# Patient Record
Sex: Male | Born: 1950 | Race: Asian | Hispanic: No | Marital: Married | Smoking: Heavy tobacco smoker
Health system: Southern US, Community
[De-identification: ages and names within clinical notes are randomized; demographics above are authoritative.]

## PROBLEM LIST (undated history)

## (undated) DIAGNOSIS — I10 Essential (primary) hypertension: Secondary | ICD-10-CM

## (undated) HISTORY — DX: Essential (primary) hypertension: I10

---

## 1997-07-31 ENCOUNTER — Emergency Department (HOSPITAL_COMMUNITY): Admission: EM | Admit: 1997-07-31 | Discharge: 1997-07-31 | Payer: Self-pay | Admitting: Emergency Medicine

## 1999-11-30 ENCOUNTER — Encounter: Admission: RE | Admit: 1999-11-30 | Discharge: 1999-11-30 | Payer: Self-pay | Admitting: Gastroenterology

## 1999-11-30 ENCOUNTER — Encounter: Payer: Self-pay | Admitting: Gastroenterology

## 2002-11-05 ENCOUNTER — Encounter: Payer: Self-pay | Admitting: Emergency Medicine

## 2002-11-05 ENCOUNTER — Emergency Department (HOSPITAL_COMMUNITY): Admission: EM | Admit: 2002-11-05 | Discharge: 2002-11-05 | Payer: Self-pay | Admitting: *Deleted

## 2002-11-21 ENCOUNTER — Encounter: Admission: RE | Admit: 2002-11-21 | Discharge: 2002-11-21 | Payer: Self-pay | Admitting: Gastroenterology

## 2002-12-15 ENCOUNTER — Encounter: Admission: RE | Admit: 2002-12-15 | Discharge: 2002-12-15 | Payer: Self-pay | Admitting: Internal Medicine

## 2003-02-19 ENCOUNTER — Ambulatory Visit (HOSPITAL_COMMUNITY): Admission: RE | Admit: 2003-02-19 | Discharge: 2003-02-19 | Payer: Self-pay | Admitting: Gastroenterology

## 2003-05-22 ENCOUNTER — Encounter (INDEPENDENT_AMBULATORY_CARE_PROVIDER_SITE_OTHER): Payer: Self-pay | Admitting: Specialist

## 2003-05-22 ENCOUNTER — Ambulatory Visit (HOSPITAL_COMMUNITY): Admission: RE | Admit: 2003-05-22 | Discharge: 2003-05-22 | Payer: Self-pay | Admitting: Gastroenterology

## 2004-01-14 ENCOUNTER — Emergency Department (HOSPITAL_COMMUNITY): Admission: EM | Admit: 2004-01-14 | Discharge: 2004-01-14 | Payer: Self-pay | Admitting: Emergency Medicine

## 2006-01-31 ENCOUNTER — Encounter: Admission: RE | Admit: 2006-01-31 | Discharge: 2006-01-31 | Payer: Self-pay | Admitting: Internal Medicine

## 2006-09-12 ENCOUNTER — Emergency Department (HOSPITAL_COMMUNITY): Admission: EM | Admit: 2006-09-12 | Discharge: 2006-09-12 | Payer: Self-pay | Admitting: Podiatry

## 2008-09-21 ENCOUNTER — Emergency Department (HOSPITAL_COMMUNITY): Admission: EM | Admit: 2008-09-21 | Discharge: 2008-09-21 | Payer: Self-pay | Admitting: Emergency Medicine

## 2008-11-13 ENCOUNTER — Encounter: Admission: RE | Admit: 2008-11-13 | Discharge: 2008-11-13 | Payer: Self-pay | Admitting: Otolaryngology

## 2009-03-01 ENCOUNTER — Encounter: Admission: RE | Admit: 2009-03-01 | Discharge: 2009-03-01 | Payer: Self-pay | Admitting: Otolaryngology

## 2010-02-11 ENCOUNTER — Encounter (INDEPENDENT_AMBULATORY_CARE_PROVIDER_SITE_OTHER): Payer: Self-pay | Admitting: *Deleted

## 2010-02-11 LAB — CONVERTED CEMR LAB: PSA: 6.99 ng/mL — ABNORMAL HIGH (ref <=4.00)

## 2010-04-22 LAB — CBC
HCT: 44 % (ref 39.0–52.0)
Hemoglobin: 15 g/dL (ref 13.0–17.0)
MCV: 96.3 fL (ref 78.0–100.0)
RBC: 4.57 MIL/uL (ref 4.22–5.81)

## 2010-04-22 LAB — COMPREHENSIVE METABOLIC PANEL
Albumin: 3.8 g/dL (ref 3.5–5.2)
BUN: 11 mg/dL (ref 6–23)
CO2: 29 mEq/L (ref 19–32)
Chloride: 103 mEq/L (ref 96–112)
Creatinine, Ser: 0.8 mg/dL (ref 0.4–1.5)
GFR calc Af Amer: >60 mL/min (ref >60)
Glucose, Bld: 94 mg/dL (ref 70–99)
Total Protein: 7.3 g/dL (ref 6.0–8.3)

## 2010-04-22 LAB — URINALYSIS, ROUTINE W REFLEX MICROSCOPIC
Ketones, ur: NEGATIVE mg/dL
Protein, ur: NEGATIVE mg/dL
Specific Gravity, Urine: 1.007 (ref 1.005–1.030)
Urobilinogen, UA: 0.2 mg/dL (ref 0.0–1.0)
pH: 7 (ref 5.0–8.0)

## 2010-04-22 LAB — DIFFERENTIAL
Eosinophils Relative: 1 % (ref 0–5)
Monocytes Absolute: 0.6 10*3/uL (ref 0.1–1.0)
Monocytes Relative: 8 % (ref 3–12)
Neutro Abs: 4.3 10*3/uL (ref 1.7–7.7)

## 2010-04-22 LAB — URINE MICROSCOPIC-ADD ON

## 2010-06-03 NOTE — Op Note (Signed)
NAME:  Jay Bell, Jay Bell                                ACCOUNT NO.:  [id number]   MEDICAL RECORD NO.:  [id number]                   PATIENT TYPE:  AMB   LOCATION:  ENDO                                 FACILITY:  [facility]   PHYSICIAN:  Bernette Redbird, M.D.                DATE OF BIRTH:  1950/02/04   DATE OF PROCEDURE:  05/22/2003  DATE OF DISCHARGE:                                 OPERATIVE REPORT   PROCEDURE:  Upper endoscopy with biopsies.   INDICATIONS FOR PROCEDURE:  A 60 year old Asian immigrant with nonspecific  dyspeptic symptoms, finally responding to a combination of Zelnorm and  Prilosec. He has previously been treated for H. pylori infection.   FINDINGS:  Normal exam.   DESCRIPTION OF PROCEDURE:  The nature, purpose and risk of the procedure had  been discussed with the patient who provided written consent.  Sedation was  fentanyl 50 mcg and Versed 5 mg without arrhythmias or desaturations.  The  Olympus video endoscope was passed under direct vision. The vocal cords were  unremarkable in appearance. The esophagus was readily entered and was  normal, without evidence of reflux esophagitis, Barrett's esophagus,  varices, infection, neoplasia or any ring, stricture or hiatal hernia. The  stomach contained no significant residual and had normal mucosa without  evidence of gastritis, erosions, ulcers, polyps or masses including  retroflexion of the proximal stomach. The pylorus, duodenal bulb and second  duodenum looked normal. Prior to removal of the scope, duodenal and antral  biopsies were obtained.  The patient tolerated the procedure well and there  were no apparent complications.   IMPRESSION:  Normal endoscopy.  No source of previous dyspeptic symptoms  endoscopically evident.   PLAN:  Await pathology results.  Continue current medical therapy.                                               Bernette Redbird, M.D.    RB/MEDQ  D:  05/22/2003  T:  05/22/2003  Job:  [id number]   cc:   Gabriel Earing, M.D.  [address]  [place]  [place] [address]  Fax: [phone]

## 2012-02-16 ENCOUNTER — Encounter (HOSPITAL_COMMUNITY): Payer: Self-pay

## 2012-02-16 ENCOUNTER — Emergency Department (INDEPENDENT_AMBULATORY_CARE_PROVIDER_SITE_OTHER): Payer: No Typology Code available for payment source

## 2012-02-16 ENCOUNTER — Emergency Department (INDEPENDENT_AMBULATORY_CARE_PROVIDER_SITE_OTHER)
Admission: EM | Admit: 2012-02-16 | Discharge: 2012-02-16 | Disposition: A | Discharge status: Home / Self Care | Payer: No Typology Code available for payment source | Source: Home / Self Care | Attending: Family Medicine | Admitting: Family Medicine

## 2012-02-16 DIAGNOSIS — I10 Essential (primary) hypertension: Secondary | ICD-10-CM

## 2012-02-16 DIAGNOSIS — Z23 Encounter for immunization: Secondary | ICD-10-CM

## 2012-02-16 DIAGNOSIS — F17200 Nicotine dependence, unspecified, uncomplicated: Secondary | ICD-10-CM

## 2012-02-16 DIAGNOSIS — R1013 Epigastric pain: Secondary | ICD-10-CM

## 2012-02-16 DIAGNOSIS — K219 Gastro-esophageal reflux disease without esophagitis: Secondary | ICD-10-CM

## 2012-02-16 LAB — LIPID PANEL
Cholesterol: 226 mg/dL — ABNORMAL HIGH (ref 0–200)
Total CHOL/HDL Ratio: 5.8 RATIO
VLDL: 55 mg/dL — ABNORMAL HIGH (ref 0–40)

## 2012-02-16 LAB — COMPREHENSIVE METABOLIC PANEL
ALT: 19 U/L (ref 0–53)
Albumin: 3.6 g/dL (ref 3.5–5.2)
BUN: 14 mg/dL (ref 6–23)
Calcium: 9.2 mg/dL (ref 8.4–10.5)
Chloride: 104 mEq/L (ref 96–112)
Creatinine, Ser: 0.69 mg/dL (ref 0.50–1.35)
GFR calc Af Amer: >90 mL/min (ref >90)
Glucose, Bld: 100 mg/dL — ABNORMAL HIGH (ref 70–99)
Sodium: 140 mEq/L (ref 135–145)
Total Protein: 7.5 g/dL (ref 6.0–8.3)

## 2012-02-16 LAB — CBC
HCT: 45.3 % (ref 39.0–52.0)
MCH: 33.3 pg (ref 26.0–34.0)
MCV: 94.8 fL (ref 78.0–100.0)
Platelets: 252 10*3/uL (ref 150–400)

## 2012-02-16 LAB — TSH: TSH: 0.853 u[IU]/mL (ref 0.350–4.500)

## 2012-02-16 MED ORDER — HYDROCHLOROTHIAZIDE 12.5 MG PO TABS: 12.5000 mg | ORAL_TABLET | Freq: Every day | ORAL | Status: DC

## 2012-02-16 MED ORDER — OMEPRAZOLE 20 MG PO CPDR: 60.0000 mg | DELAYED_RELEASE_CAPSULE | Freq: Every day | ORAL | Status: DC

## 2012-02-16 MED ORDER — INFLUENZA VIRUS VACC SPLIT PF IM SUSP
0.5000 mL | Freq: Once | INTRAMUSCULAR | Status: AC
  Administered 2012-02-16: 0.5 mL via INTRAMUSCULAR

## 2012-02-16 MED ORDER — SERTRALINE HCL 25 MG PO TABS: 25.0000 mg | ORAL_TABLET | Freq: Every day | ORAL | Status: DC

## 2012-02-16 NOTE — ED Provider Notes (Signed)
History   CSN: [id number]  Arrival date & time 02/16/12  1013   First MD Initiated Contact with Patient 02/16/12 1028     Chief Complaint  Patient presents with  . Abdominal Pain   HPI Pt reports that he has had 4 months of symptoms of pain in right back of rib cage.   Not much pain with taking a deep breath.   Pt says that he has not been coughing or having cold symptoms or sneezing.  The patient also has a history of severe acid reflux disease.  He has been evaluated by gastroenterology several years ago but because he lost his medical insurance he has lost his access to GI care.  The patient reports that he takes omeprazole twice per day.  He also takes famotidine and night.  He reports that this barely contains his symptoms.  The patient reports that he is belching all the time.  He reports that he eats liquids like soup he experiences epigastric pain.  He denies nausea and vomiting.  He denies shortness of breath and chest pain.  He has not had symptoms associated with taking deep breaths.  He has no known physical injury to the area of the chest or to the abdomen.  The patient reports that he is under a lot of stress now because he is currently in college and studying all through the night and mostly during the day and classes.  He reports that he also smokes cigarettes regularly.  He has not been able to quit.  History reviewed. No pertinent past medical history.  History reviewed. No pertinent past surgical history.  Family history reviewed and noncontributory  History  Substance Use Topics  . Smoking status: Light Tobacco Smoker  . Smokeless tobacco: Not on file  . Alcohol Use: No    Review of Systems  Constitutional: Negative.   HENT: Negative.   Respiratory: Positive for cough. Negative for wheezing.   Gastrointestinal:       Epigastric abdominal pain  Musculoskeletal: Positive for back pain.  All other systems reviewed and are negative.   Allergies  Penicillins  Home  Medications   Current Outpatient Rx  Name  Route  Sig  Dispense  Refill  . OMEPRAZOLE 20 MG PO CPDR   Oral   Take 20 mg by mouth daily.         Marland Kitchen ZOLOFT PO   Oral   Take by mouth.           BP 153/100  Pulse 99  Temp 97.7 F (36.5 C)  Resp 16  SpO2 99%  Physical Exam  Nursing note and vitals reviewed. Constitutional: He is oriented to person, place, and time. He appears well-developed and well-nourished. No distress.  HENT:  Head: Normocephalic and atraumatic.  Right Ear: External ear normal.  Left Ear: External ear normal.  Nose: Nose normal.  Eyes: Conjunctivae normal and EOM are normal. Pupils are equal, round, and reactive to light.  Neck: Normal range of motion. Neck supple.  Cardiovascular: Normal rate, regular rhythm and normal heart sounds.   Pulmonary/Chest: Effort normal and breath sounds normal. No respiratory distress. He has no wheezes. He has no rales. He exhibits no tenderness.  Abdominal: Soft. Normal appearance and bowel sounds are normal. There is no hepatosplenomegaly. There is tenderness in the epigastric area. There is no rigidity, no guarding, no CVA tenderness and negative Murphy's sign. No hernia. Hernia confirmed negative in the ventral area.    Neurological:  He is alert and oriented to person, place, and time. He has normal reflexes.  Skin: Skin is warm and dry.  Psychiatric: He has a normal mood and affect. His behavior is normal. Judgment and thought content normal.    ED Course  Procedures (including critical care time)  Labs Reviewed - No data to display No results found.  No diagnosis found.  MDM  IMPRESSION  Severe GERD   Suspect peptic ulcer disease   Epigastric pain  Depression  Chronic Active Nicotine Dependence  Hypertension  RECOMMENDATIONS / PLAN Acute abdominal series Increase omeprazole to 3 tabs po daily for 2 weeks plus take famotadine QHS Added HCTZ 12.5 mg po daily for blood pressure Check labs  today The patient was counseled on the dangers of tobacco use, and was advised to quit.  Reviewed strategies to maximize success, including removing cigarettes and smoking materials from environment, stress management and substitution of other forms of reinforcement. The patient needs a GI consultation but because he doesn't have any medical insurance so he says he cannot afford to pay $185 up front to see a gastroenterologist.  Therefore what we will do is we will try the above treatment plan and I will see him back and hopefully this will improve his symptoms.  I explained to him the risks of not seeing a gastroenterologist and having an EGD study done.  He could have an ulcer.  He could be tested for Helicobacter pylori.    FOLLOW UP 1 month   The patient was given clear instructions to go to ER or return to medical center if symptoms don't improve, worsen or new problems develop.  The patient verbalized understanding.  The patient was told to call to get lab results if they haven't heard anything in the next week.             Cleora Fleet, MD 02/16/12 1447

## 2012-02-16 NOTE — ED Notes (Signed)
Patient states his back hurts on the right side when he breathes. This has been going on for almost 4 month Also has a history of GERD

## 2012-02-21 NOTE — Progress Notes (Signed)
Quick Note:  Please notify patient that his labs came back suggestive of pre-diabetes mellitus. His hemoglobin A1c was elevated at 5.9%. Also, his cholesterol was elevated. I'm recommending that he start a low-fat low-cholesterol diet and exercise 5 times per week 30 minutes per session. In addition please send him some information on prediabetes diet and physical activity plans. Recommend we recheck his labs again in 3 months.  Rodney Langton, MD, CDE, FAAFP [facility] [place], [place]   ______

## 2012-03-18 ENCOUNTER — Encounter (HOSPITAL_COMMUNITY): Payer: Self-pay

## 2012-03-18 ENCOUNTER — Ambulatory Visit (HOSPITAL_COMMUNITY)
Admit: 2012-03-18 | Discharge: 2012-03-18 | Disposition: A | Discharge status: Home / Self Care | Payer: No Typology Code available for payment source | Attending: Internal Medicine | Admitting: Internal Medicine

## 2012-03-18 ENCOUNTER — Emergency Department (HOSPITAL_COMMUNITY)
Admission: EM | Admit: 2012-03-18 | Discharge: 2012-03-18 | Disposition: A | Discharge status: Home / Self Care | Payer: No Typology Code available for payment source | Source: Home / Self Care

## 2012-03-18 DIAGNOSIS — R142 Eructation: Secondary | ICD-10-CM | POA: Insufficient documentation

## 2012-03-18 DIAGNOSIS — R141 Gas pain: Secondary | ICD-10-CM | POA: Insufficient documentation

## 2012-03-18 DIAGNOSIS — K7689 Other specified diseases of liver: Secondary | ICD-10-CM | POA: Insufficient documentation

## 2012-03-18 MED ORDER — PANTOPRAZOLE SODIUM 40 MG PO TBEC: 40.0000 mg | DELAYED_RELEASE_TABLET | Freq: Two times a day (BID) | ORAL | Status: DC

## 2012-03-18 MED ORDER — PANTOPRAZOLE SODIUM 40 MG PO PACK: 40.0000 mg | PACK | Freq: Every day | ORAL | Status: DC

## 2012-03-18 NOTE — ED Provider Notes (Signed)
History     CSN:   Arrival date & time 03/18/12  [id number]  62 year old male who presents for followup of his dyspepsia, patient was found to have borderline hypertension during her last admission. He was prescribed hydrochlorothiazide 12.5 mg by mouth daily which she has been compliant with. The patient states that he had dental work done 2 weeks ago and had to have 2 weeks of antibiotics following which he had a lot of bloating, he started taking a probiotic and since then has bloating has improved. He still has to wake up in the night with significant heartburn. He has been taking Prilosec and Pepcid on a daily basis. He is also has lost about 4 pounds. Denies any melena or hematochezia    Chief Complaint  Patient presents with  . Follow-up    (Consider location/radiation/quality/duration/timing/severity/associated sxs/prior treatment) HPI  History reviewed. No pertinent past medical history.  History reviewed. No pertinent past surgical history.  No family history on file.  History  Substance Use Topics  . Smoking status: Light Tobacco Smoker  . Smokeless tobacco: Not on file  . Alcohol Use: No      Review of Systems  Allergies  Penicillins  Home Medications   Current Outpatient Rx  Name  Route  Sig  Dispense  Refill  . hydrochlorothiazide (HYDRODIURIL) 12.5 MG tablet   Oral   Take 1 tablet (12.5 mg total) by mouth daily.   30 tablet   3   . omeprazole (PRILOSEC) 20 MG capsule   Oral   Take 3 capsules (60 mg total) by mouth daily.   90 capsule   3   . sertraline (ZOLOFT) 25 MG tablet   Oral   Take 1 tablet (25 mg total) by mouth daily.   30 tablet   3   . Sertraline HCl (ZOLOFT PO)   Oral   Take by mouth.           BP 132/83  Pulse 75  Temp(Src) 98.1 F (36.7 C) (Oral)  SpO2 100%  Physical Exam Constitutional: He is oriented to person, place, and time. He appears well-developed and well-nourished. No distress.  HENT:  Head: Normocephalic and  atraumatic.  Right Ear: External ear normal.  Left Ear: External ear normal.  Nose: Nose normal.  Eyes: Conjunctivae normal and EOM are normal. Pupils are equal, round, and reactive to light.  Neck: Normal range of motion. Neck supple.  Cardiovascular: Normal rate, regular rhythm and normal heart sounds.  Pulmonary/Chest: Effort normal and breath sounds normal. No respiratory distress. He has no wheezes. He has no rales. He exhibits no tenderness.  Abdominal: Soft. Normal appearance and bowel sounds are normal. There is no hepatosplenomegaly. There is tenderness in the epigastric area. There is no rigidity, no guarding, no CVA tenderness and negative Murphy's sign. No hernia. Hernia confirmed negative in the ventral area.     ED Course  Procedures (including critical care time)  Labs Reviewed  H. PYLORI ANTIBODY, IGG   No results found.   No diagnosis found.    MDM  IMPRESSION  Severe GERD  Suspect peptic ulcer disease  Epigastric pain  Depression  Chronic Active Nicotine Dependence  Hypertension   #1 hypertension continue hydrochlorothiazide   #2 gastroesophageal reflux disease. Patient to be switched to Protonix 40 mg by mouth daily, will do a right upper quadrant ultrasound to rule out gallstones. At this point the patient denies any alarm symptoms of significant weight loss or blood in the  stool. He cannot afford an endoscopy at this point had one 8 years ago in [place]. #3 prediabetes discussed hemoglobin A1c results of 5.9. Patient counseled about healthy food choices #4 constipation patient recommended to have over-the-counter FiberCon, Colace #5 followup in 2 months      Richarda Overlie, MD 03/18/12 1038

## 2012-03-18 NOTE — ED Notes (Signed)
Follow up- HTN Stomach issues Gall bladder

## 2012-03-19 LAB — H. PYLORI ANTIBODY, IGG: H Pylori IgG: 0.49 {ISR}

## 2012-04-01 ENCOUNTER — Encounter (HOSPITAL_COMMUNITY): Payer: Self-pay

## 2012-04-01 ENCOUNTER — Emergency Department (HOSPITAL_COMMUNITY)
Admission: EM | Admit: 2012-04-01 | Discharge: 2012-04-01 | Disposition: A | Discharge status: Home / Self Care | Payer: No Typology Code available for payment source | Source: Home / Self Care

## 2012-04-01 DIAGNOSIS — M549 Dorsalgia, unspecified: Secondary | ICD-10-CM

## 2012-04-01 MED ORDER — CYCLOBENZAPRINE HCL 5 MG PO TABS: 5.0000 mg | ORAL_TABLET | Freq: Three times a day (TID) | ORAL | Status: DC | PRN

## 2012-04-01 MED ORDER — TRAMADOL HCL 50 MG PO TABS: 50.0000 mg | ORAL_TABLET | Freq: Four times a day (QID) | ORAL | Status: DC | PRN

## 2012-04-01 NOTE — ED Provider Notes (Signed)
History     CSN: [id number]  Arrival date & time 04/01/12  1002   None     Chief Complaint  Patient presents with  . Follow-up    (Consider location/radiation/quality/duration/timing/severity/associated sxs/prior treatment) HPI Patient is 62 year old male who presents to clinic for regular followup, he reports ongoing and intermittent episodes of muscle spasms that initially start at lower back area. He reports associated lower back pain, sharp in etiology, 3/10 in severity when present, with no specific alleviating factors. Patient denies any numbness or tingling, no specific focal neurological deficits. He reports that his company has closed down and he has been stressed out recently and would like to go back to work. He is afraid that his back pain will prevent him to go back to work. He denies fevers or chills, no other systemic symptoms, no specific injury or trauma to the back area.  History reviewed. No pertinent past medical history.  History reviewed. No pertinent past surgical history.  No known family medical history  History  Substance Use Topics  . Smoking status: Light Tobacco Smoker  . Smokeless tobacco: Not on file  . Alcohol Use: No      Review of Systems  Constitutional: Negative for fever, chills, diaphoresis, activity change, appetite change and fatigue.  HENT: Negative for ear pain, nosebleeds, congestion, facial swelling, rhinorrhea, neck pain, neck stiffness and ear discharge.   Eyes: Negative for pain, discharge, redness, itching and visual disturbance.  Respiratory: Negative for cough, choking, chest tightness, shortness of breath, wheezing and stridor.   Cardiovascular: Negative for chest pain, palpitations and leg swelling.  Gastrointestinal: Negative for abdominal distention.  Genitourinary: Negative for dysuria, urgency, frequency, hematuria, flank pain, decreased urine volume, difficulty urinating and dyspareunia.  Musculoskeletal: Negative for  joint swelling, arthralgias and gait problem.  Neurological: Negative for dizziness, tremors, seizures, syncope, facial asymmetry, speech difficulty, weakness, light-headedness, numbness and headaches.  Hematological: Negative for adenopathy. Does not bruise/bleed easily.  Psychiatric/Behavioral: Negative for hallucinations, behavioral problems, confusion, dysphoric mood, decreased concentration and agitation.    Allergies  Penicillins  Home Medications   Current Outpatient Rx  Name  Route  Sig  Dispense  Refill  . cyclobenzaprine (FLEXERIL) 5 MG tablet   Oral   Take 1 tablet (5 mg total) by mouth 3 (three) times daily as needed for muscle spasms.   45 tablet   3   . hydrochlorothiazide (HYDRODIURIL) 12.5 MG tablet   Oral   Take 1 tablet (12.5 mg total) by mouth daily.   30 tablet   3   . pantoprazole (PROTONIX) 40 MG tablet   Oral   Take 1 tablet (40 mg total) by mouth 2 (two) times daily.   60 tablet   2   . sertraline (ZOLOFT) 25 MG tablet   Oral   Take 1 tablet (25 mg total) by mouth daily.   30 tablet   3   . traMADol (ULTRAM) 50 MG tablet   Oral   Take 1 tablet (50 mg total) by mouth every 6 (six) hours as needed for pain.   45 tablet   1     Pulse 68  Temp(Src) 97.7 F (36.5 C) (Oral)  Physical Exam  Constitutional: Appears well-developed and well-nourished. No distress.  HENT: Normocephalic. External right and left ear normal. Oropharynx is clear and moist.  Eyes: Conjunctivae and EOM are normal. PERRLA, no scleral icterus.  Neck: Normal ROM. Neck supple. No JVD. No tracheal deviation. No thyromegaly.  CVS: RRR,  S1/S2 +, no murmurs, no gallops, no carotid bruit.  Pulmonary: Effort and breath sounds normal, no stridor, rhonchi, wheezes, rales.  Abdominal: Soft. BS +,  no distension, tenderness, rebound or guarding.  Musculoskeletal: Normal range of motion. No edema, paraspinal tenderness in lumbar area demonstrated.  Lymphadenopathy: No  lymphadenopathy noted, cervical, inguinal. Neuro: Alert. Normal reflexes, muscle tone coordination. No cranial nerve deficit. Skin: Skin is warm and dry. No rash noted. Not diaphoretic. No erythema. No pallor.  Psychiatric: Normal mood and affect. Behavior, judgment, thought content normal.    ED Course  Procedures (including critical care time)  Labs Reviewed - No data to display No results found.   1. Back pain     - Likely related to acute muscle sprain, back exercises recommended with applying warm compresses - I have also prescribed Flexeril to help with intermittent episodes of muscle spasm - Patient notified about side effects of Flexeril, no driving advised to patient when he takes that medication - Patient advised to come back and see [place] sooner then one month if his symptoms do not get better or get worse   2. Hypertension - I have reviewed blood pressure and currently stable - Regular monitoring advise  3. acid reflux - H. pylori test negative - Advised to continue protonic MDM  Back pain        Dorothea Ogle, MD 04/01/12 1055

## 2012-04-01 NOTE — ED Notes (Signed)
Follow up from ultrasound Here for results

## 2012-04-11 MED ORDER — HYDROCHLOROTHIAZIDE 25 MG PO TABS: 12.5000 mg | ORAL_TABLET | Freq: Every day | ORAL | Status: DC

## 2012-04-15 ENCOUNTER — Encounter (HOSPITAL_COMMUNITY): Payer: Self-pay

## 2012-04-15 ENCOUNTER — Emergency Department (HOSPITAL_COMMUNITY)
Admission: EM | Admit: 2012-04-15 | Discharge: 2012-04-15 | Disposition: A | Discharge status: Home / Self Care | Payer: No Typology Code available for payment source | Source: Home / Self Care

## 2012-04-15 DIAGNOSIS — G8929 Other chronic pain: Secondary | ICD-10-CM

## 2012-04-15 MED ORDER — TRAMADOL HCL 50 MG PO TABS: 50.0000 mg | ORAL_TABLET | Freq: Four times a day (QID) | ORAL | Status: DC | PRN

## 2012-04-15 NOTE — ED Provider Notes (Signed)
History     CSN: [id number]  Arrival date & time 04/15/12  1018   First MD Initiated Contact with Patient 04/15/12 1035      Chief Complaint  Patient presents with  . Blurred Vision    (Consider location/radiation/quality/duration/timing/severity/associated sxs/prior treatment) HPI Patient is 62 year old male who presents for regular followup. He would like to get a refill on pain medicine as he reports it helps with his chronic pain in lower extremities. He explains his pain has been chronic in both lower extremities, intermittent and about in etiology, nonradiating, with no specific aggravating or alleviating factors when he takes analgesia. He reports pain 5/10 in severity when present. He explains that medicine decrease the pain to 1/10.  History reviewed. No pertinent past medical history.  History reviewed. No pertinent past surgical history.  Family history of HTN  History  Substance Use Topics  . Smoking status: Light Tobacco Smoker  . Smokeless tobacco: Not on file  . Alcohol Use: No      Review of Systems  Constitutional: Negative for fever, chills, diaphoresis, activity change, appetite change and fatigue.  HENT: Negative for ear pain, nosebleeds, congestion, facial swelling, rhinorrhea, neck pain, neck stiffness and ear discharge.   Eyes: Negative for pain, discharge, redness, itching and visual disturbance.  Respiratory: Negative for cough, choking, chest tightness, shortness of breath, wheezing and stridor.   Cardiovascular: Negative for chest pain, palpitations and leg swelling.  Gastrointestinal: Negative for abdominal distention.  Genitourinary: Negative for dysuria, urgency, frequency, hematuria, flank pain, decreased urine volume, difficulty urinating and dyspareunia.  Musculoskeletal: Negative for back pain, joint swelling, arthralgias and gait problem.  Neurological: Negative for dizziness, tremors, seizures, syncope, facial asymmetry, speech  difficulty, weakness, light-headedness, numbness and headaches.  Hematological: Negative for adenopathy. Does not bruise/bleed easily.  Psychiatric/Behavioral: Negative for hallucinations, behavioral problems, confusion, dysphoric mood, decreased concentration and agitation.    Allergies  Penicillins  Home Medications   Current Outpatient Rx  Name  Route  Sig  Dispense  Refill  . cyclobenzaprine (FLEXERIL) 5 MG tablet   Oral   Take 1 tablet (5 mg total) by mouth 3 (three) times daily as needed for muscle spasms.   45 tablet   3   . hydrochlorothiazide (HYDRODIURIL) 25 MG tablet   Oral   Take 0.5 tablets (12.5 mg total) by mouth daily.   16 tablet   2   . pantoprazole (PROTONIX) 40 MG tablet   Oral   Take 1 tablet (40 mg total) by mouth 2 (two) times daily.   60 tablet   2   . sertraline (ZOLOFT) 25 MG tablet   Oral   Take 1 tablet (25 mg total) by mouth daily.   30 tablet   3   . traMADol (ULTRAM) 50 MG tablet   Oral   Take 1 tablet (50 mg total) by mouth every 6 (six) hours as needed for pain.   65 tablet   1     BP 136/81  Pulse 97  Temp(Src) 97.8 F (36.6 C) (Oral)  SpO2 100%  Physical Exam  Constitutional: Appears well-developed and well-nourished. No distress.  HENT: Normocephalic. External right and left ear normal. Oropharynx is clear and moist.  Eyes: Conjunctivae and EOM are normal. PERRLA, no scleral icterus.  Neck: Normal ROM. Neck supple. No JVD. No tracheal deviation. No thyromegaly.  CVS: RRR, S1/S2 +, no murmurs, no gallops, no carotid bruit.  Pulmonary: Effort and breath sounds normal, no stridor, rhonchi, wheezes, rales.  Abdominal: Soft. BS +,  no distension, tenderness, rebound or guarding.  Musculoskeletal: Normal range of motion. No edema and no tenderness.  Lymphadenopathy: No lymphadenopathy noted, cervical, inguinal. Neuro: Alert. Normal reflexes, muscle tone coordination. No cranial nerve deficit. Skin: Skin is warm and dry. No  rash noted. Not diaphoretic. No erythema. No pallor.  Psychiatric: Normal mood and affect. Behavior, judgment, thought content normal.    ED Course  Procedures (including critical care time)  Labs Reviewed - No data to display No results found.   1. Chronic pain    - Controlled on current analgesia, will continue to prescribe tramadol - Advise regular exercising, also advise avoiding sedentary lifestyle - Prescribe pain medicine today   MDM  Monica pain, continue tramadol        Dorothea Ogle, MD 04/15/12 [phone]

## 2012-04-15 NOTE — ED Notes (Signed)
Patient states was started on a new medicine Has been having blurred vision for 1 week

## 2012-05-24 ENCOUNTER — Encounter (HOSPITAL_COMMUNITY): Payer: Self-pay

## 2012-05-24 ENCOUNTER — Emergency Department (INDEPENDENT_AMBULATORY_CARE_PROVIDER_SITE_OTHER)
Admission: EM | Admit: 2012-05-24 | Discharge: 2012-05-24 | Disposition: A | Discharge status: Home / Self Care | Payer: No Typology Code available for payment source | Source: Home / Self Care

## 2012-05-24 DIAGNOSIS — I10 Essential (primary) hypertension: Secondary | ICD-10-CM

## 2012-05-24 MED ORDER — HYDROCHLOROTHIAZIDE 25 MG PO TABS: 12.5000 mg | ORAL_TABLET | Freq: Every day | ORAL | Status: DC

## 2012-05-24 MED ORDER — TRAMADOL HCL 50 MG PO TABS
50.0000 mg | ORAL_TABLET | Freq: Four times a day (QID) | ORAL | Status: DC | PRN
Start: 2012-05-24 — End: 2012-10-07

## 2012-05-24 NOTE — ED Notes (Signed)
Patient needs referral to dentist Also would like to talk about the long term affects of tramadol

## 2012-05-24 NOTE — ED Notes (Signed)
Referral faxed to [facility] Waiting on an appt

## 2012-05-24 NOTE — ED Provider Notes (Signed)
History     CSN: [id number]  Arrival date & time 05/24/12  1004   First MD Initiated Contact with Patient 05/24/12 1017      Chief Complaint  Patient presents with  . Dental Problem    (Consider location/radiation/quality/duration/timing/severity/associated sxs/prior treatment) HPI 62 year old male with PMHx of HTN and chronic pain especially in lower extremities who presented to our clinic for follow up. He reports feeling good today and need refills on tramadol. He is compliant with pain and blood pressure medications. No chest pain, no shortness of breath and no palpitaitons. No abdominal pain and no nausea or vomiting.   History reviewed. No pertinent past medical history.  History reviewed. No pertinent past surgical history.  Family medical history significant for HTN, HLD   History  Substance Use Topics  . Smoking status: Light Tobacco Smoker  . Smokeless tobacco: Not on file  . Alcohol Use: No      Review of Systems  Constitutional: Negative for fever, chills, diaphoresis, activity change, appetite change and fatigue.  HENT: Negative for ear pain, nosebleeds, congestion, facial swelling, rhinorrhea, neck pain, neck stiffness and ear discharge.   Eyes: Negative for pain, discharge, redness, itching and visual disturbance.  Respiratory: Negative for cough, choking, chest tightness, shortness of breath, wheezing and stridor.   Cardiovascular: Negative for chest pain, palpitations and leg swelling.  Gastrointestinal: Negative for abdominal distention.  Genitourinary: Negative for dysuria, urgency, frequency, hematuria, flank pain, decreased urine volume, difficulty urinating and dyspareunia.  Musculoskeletal: Negative for back pain, joint swelling, arthralgias and gait problem.  Neurological: Negative for dizziness, tremors, seizures, syncope, facial asymmetry, speech difficulty, weakness, light-headedness, numbness and headaches.  Hematological: Negative for  adenopathy. Does not bruise/bleed easily.  Psychiatric/Behavioral: Negative for hallucinations, behavioral problems, confusion, dysphoric mood, decreased concentration and agitation.    Allergies  Penicillins  Home Medications   Current Outpatient Rx  Name  Route  Sig  Dispense  Refill  . cyclobenzaprine (FLEXERIL) 5 MG tablet   Oral   Take 1 tablet (5 mg total) by mouth 3 (three) times daily as needed for muscle spasms.   45 tablet   3   . hydrochlorothiazide (HYDRODIURIL) 25 MG tablet   Oral   Take 0.5 tablets (12.5 mg total) by mouth daily.   16 tablet   2   . pantoprazole (PROTONIX) 40 MG tablet   Oral   Take 1 tablet (40 mg total) by mouth 2 (two) times daily.   60 tablet   2   . sertraline (ZOLOFT) 25 MG tablet   Oral   Take 1 tablet (25 mg total) by mouth daily.   30 tablet   3   . traMADol (ULTRAM) 50 MG tablet   Oral   Take 1 tablet (50 mg total) by mouth every 6 (six) hours as needed for pain.   65 tablet   1     BP 145/82  Pulse 80  Temp(Src) 98 F (36.7 C)  Resp 16  SpO2 100%  Physical Exam  Constitutional: Appears well-developed and well-nourished. No distress.  HENT: Normocephalic. External right and left ear normal. Oropharynx is clear and moist.  Eyes: Conjunctivae and EOM are normal. PERRLA, no scleral icterus.  Neck: Normal ROM. Neck supple. No JVD. No tracheal deviation. No thyromegaly.  CVS: RRR, S1/S2 +, no murmurs, no gallops, no carotid bruit.  Pulmonary: Effort and breath sounds normal, no stridor, rhonchi, wheezes, rales.  Abdominal: Soft. BS +,  no distension, tenderness,  rebound or guarding.  Musculoskeletal: Normal range of motion. No edema and no tenderness.  Lymphadenopathy: No lymphadenopathy noted, cervical, inguinal. Neuro: Alert. Normal reflexes, muscle tone coordination. No cranial nerve deficit. Skin: Skin is warm and dry. No rash noted. Not diaphoretic. No erythema. No pallor.  Psychiatric: Normal mood and affect.  Behavior, judgment, thought content normal.     ED Course  Procedures (including critical care time)  Labs Reviewed - No data to display No results found.   1. Hypertension   - We have discussed target BP range - I have advised pt to check BP regularly and to call us back if the numbers are higher than 140/90 - discussed the importance of compliance with medical therapy and diet   2. Chronic pain - prescription for tramadol provided    MDM  Hypertension Chronic pain        Alison Murray, MD 05/24/12 1029

## 2012-10-07 ENCOUNTER — Ambulatory Visit: Payer: No Typology Code available for payment source | Attending: Internal Medicine | Admitting: Internal Medicine

## 2012-10-07 ENCOUNTER — Encounter: Payer: Self-pay | Admitting: Internal Medicine

## 2012-10-07 VITALS — BP 159/89 | HR 74 | Temp 98.3°F | Resp 14 | Ht 66.0 in | Wt 128.0 lb

## 2012-10-07 DIAGNOSIS — N400 Enlarged prostate without lower urinary tract symptoms: Secondary | ICD-10-CM

## 2012-10-07 DIAGNOSIS — R350 Frequency of micturition: Secondary | ICD-10-CM | POA: Insufficient documentation

## 2012-10-07 DIAGNOSIS — R3915 Urgency of urination: Secondary | ICD-10-CM | POA: Insufficient documentation

## 2012-10-07 DIAGNOSIS — N138 Other obstructive and reflux uropathy: Secondary | ICD-10-CM | POA: Insufficient documentation

## 2012-10-07 DIAGNOSIS — N401 Enlarged prostate with lower urinary tract symptoms: Secondary | ICD-10-CM | POA: Insufficient documentation

## 2012-10-07 DIAGNOSIS — Z09 Encounter for follow-up examination after completed treatment for conditions other than malignant neoplasm: Secondary | ICD-10-CM | POA: Insufficient documentation

## 2012-10-07 DIAGNOSIS — I10 Essential (primary) hypertension: Secondary | ICD-10-CM | POA: Insufficient documentation

## 2012-10-07 MED ORDER — TRAMADOL HCL 50 MG PO TABS: 50.0000 mg | ORAL_TABLET | Freq: Four times a day (QID) | ORAL | Status: DC | PRN

## 2012-10-07 MED ORDER — CYCLOBENZAPRINE HCL 5 MG PO TABS: 5.0000 mg | ORAL_TABLET | Freq: Three times a day (TID) | ORAL | Status: DC | PRN

## 2012-10-07 MED ORDER — SERTRALINE HCL 25 MG PO TABS: 50.0000 mg | ORAL_TABLET | Freq: Every day | ORAL | Status: DC

## 2012-10-07 NOTE — Progress Notes (Signed)
Patient ID: Jay Bell, male   DOB: 05/14/1950, 62 y.o.   MRN: [id number]   CC: Followup   HPI: Patient is 62 year old male who presents to clinic for regular followup and would like referral to urologist. He explains that several years ago he has been diagnosed with BPH and he feels that his symptoms are getting worse. He wakes up frequently at night for urinating and describes difficulty with complete emptying of the bladder, intermittent episodes of dysuria and urinary urgency. He denies fevers and chills, no systemic symptoms such as weight loss or night sweats, no abdominal or neurological concerns.  Allergies  Allergen Reactions  . Penicillins    Past Medical History  Diagnosis Date  . Hypertension    Current Outpatient Prescriptions on File Prior to Visit  Medication Sig Dispense Refill  . hydrochlorothiazide (HYDRODIURIL) 25 MG tablet Take 0.5 tablets (12.5 mg total) by mouth daily.  30 tablet  2  . pantoprazole (PROTONIX) 40 MG tablet Take 1 tablet (40 mg total) by mouth 2 (two) times daily.  60 tablet  2  . [DISCONTINUED] omeprazole (PRILOSEC) 20 MG capsule Take 3 capsules (60 mg total) by mouth daily.  90 capsule  3   No current facility-administered medications on file prior to visit.   No known family medical history  History   Social History  . Marital Status: Married    Spouse Name: N/A    Number of Children: N/A  . Years of Education: N/A   Occupational History  . Not on file.   Social History Main Topics  . Smoking status: Light Tobacco Smoker  . Smokeless tobacco: Not on file  . Alcohol Use: No  . Drug Use:   . Sexual Activity:    Other Topics Concern  . Not on file   Social History Narrative  . No narrative on file    Review of Systems  Constitutional: Negative for fever, chills, diaphoresis, activity change, appetite change and fatigue.  HENT: Negative for ear pain, nosebleeds, congestion, facial swelling, rhinorrhea, neck pain, neck stiffness and  ear discharge.   Eyes: Negative for pain, discharge, redness, itching and visual disturbance.  Respiratory: Negative for cough, choking, chest tightness, shortness of breath, wheezing and stridor.   Cardiovascular: Negative for chest pain, palpitations and leg swelling.  Gastrointestinal: Negative for abdominal distention.  Genitourinary: Per history of present illness  Musculoskeletal: Negative for joint swelling, arthralgias and gait problem.  Neurological: Negative for dizziness, tremors, seizures, syncope, facial asymmetry, speech difficulty, weakness, light-headedness, numbness and headaches.  Hematological: Negative for adenopathy. Does not bruise/bleed easily.  Psychiatric/Behavioral: Negative for hallucinations, behavioral problems, confusion, dysphoric mood, decreased concentration and agitation.    Objective:   Filed Vitals:   10/07/12 0948  BP: 159/89  Pulse: 74  Temp: 98.3 F (36.8 C)  Resp: 14    Physical Exam  Constitutional: Appears well-developed and well-nourished. No distress.  CVS: RRR, S1/S2 +, no murmurs, no gallops, no carotid bruit.  Pulmonary: Effort and breath sounds normal, no stridor, rhonchi, wheezes, rales.  Abdominal: Soft. BS +,  no distension, tenderness, rebound or guarding.   genitourinary: Patient would like referral to specialist, does not want a prostate exam  Lab Results  Component Value Date   WBC 5.7 02/16/2012   HGB 15.9 02/16/2012   HCT 45.3 02/16/2012   MCV 94.8 02/16/2012   PLT 252 02/16/2012   Lab Results  Component Value Date   CREATININE 0.69 02/16/2012   BUN 14 02/16/2012  NA 140 02/16/2012   K 3.6 02/16/2012   CL 104 02/16/2012   CO2 29 02/16/2012    Lab Results  Component Value Date   HGBA1C 5.9* 02/16/2012   Lipid Panel     Component Value Date/Time   CHOL 226* 02/16/2012 1115   TRIG 273* 02/16/2012 1115   HDL 39* 02/16/2012 1115   CHOLHDL 5.8 02/16/2012 1115   VLDL 55* 02/16/2012 1115   LDLCALC 132* 02/16/2012 1115        Assessment and plan:   Urinary urgency and frequency - this appears to be chronic in nature, will check urinalysis for clear evaluation but will hold off on antibiotics for right now. Will also place referral to urologist for further evaluation. If urinalysis suggestive of infectious etiology we'll provide antibiotic.

## 2012-10-07 NOTE — Progress Notes (Signed)
Pt is here for HTN and concerned about prostate He is alert w/no signs of acute distress.

## 2012-10-08 LAB — URINALYSIS, ROUTINE W REFLEX MICROSCOPIC
Bilirubin Urine: NEGATIVE
Hgb urine dipstick: NEGATIVE
Ketones, ur: NEGATIVE mg/dL
Nitrite: NEGATIVE
pH: 7 (ref 5.0–8.0)

## 2012-12-23 ENCOUNTER — Ambulatory Visit: Payer: No Typology Code available for payment source | Attending: Internal Medicine

## 2013-01-03 ENCOUNTER — Other Ambulatory Visit: Payer: Self-pay | Admitting: Emergency Medicine

## 2013-01-03 MED ORDER — HYDROCHLOROTHIAZIDE 25 MG PO TABS: 12.5000 mg | ORAL_TABLET | Freq: Every day | ORAL | Status: DC

## 2013-01-20 ENCOUNTER — Encounter: Payer: Self-pay | Admitting: Internal Medicine

## 2013-01-20 ENCOUNTER — Ambulatory Visit: Payer: No Typology Code available for payment source | Attending: Internal Medicine | Admitting: Internal Medicine

## 2013-01-20 VITALS — BP 139/83 | HR 83 | Temp 98.7°F | Resp 16 | Ht 64.0 in | Wt 126.0 lb

## 2013-01-20 DIAGNOSIS — K219 Gastro-esophageal reflux disease without esophagitis: Secondary | ICD-10-CM

## 2013-01-20 DIAGNOSIS — F32A Depression, unspecified: Secondary | ICD-10-CM | POA: Insufficient documentation

## 2013-01-20 DIAGNOSIS — F3289 Other specified depressive episodes: Secondary | ICD-10-CM

## 2013-01-20 DIAGNOSIS — H169 Unspecified keratitis: Secondary | ICD-10-CM

## 2013-01-20 DIAGNOSIS — I10 Essential (primary) hypertension: Secondary | ICD-10-CM

## 2013-01-20 DIAGNOSIS — N400 Enlarged prostate without lower urinary tract symptoms: Secondary | ICD-10-CM

## 2013-01-20 DIAGNOSIS — F329 Major depressive disorder, single episode, unspecified: Secondary | ICD-10-CM | POA: Insufficient documentation

## 2013-01-20 DIAGNOSIS — M549 Dorsalgia, unspecified: Secondary | ICD-10-CM

## 2013-01-20 MED ORDER — URINOZINC PO CAPS: 1.0000 | ORAL_CAPSULE | Freq: Two times a day (BID) | ORAL | Status: AC

## 2013-01-20 MED ORDER — PANTOPRAZOLE SODIUM 40 MG PO TBEC: 40.0000 mg | DELAYED_RELEASE_TABLET | Freq: Two times a day (BID) | ORAL | Status: DC

## 2013-01-20 MED ORDER — TRAMADOL HCL 50 MG PO TABS: 50.0000 mg | ORAL_TABLET | Freq: Four times a day (QID) | ORAL | Status: DC | PRN

## 2013-01-20 MED ORDER — PREDNISOLONE ACETATE 1 % OP SUSP: 1.0000 [drp] | Freq: Four times a day (QID) | OPHTHALMIC | Status: DC

## 2013-01-20 MED ORDER — SERTRALINE HCL 50 MG PO TABS: 50.0000 mg | ORAL_TABLET | Freq: Every day | ORAL | Status: DC

## 2013-01-20 MED ORDER — HYDROCHLOROTHIAZIDE 12.5 MG PO TABS: 12.5000 mg | ORAL_TABLET | Freq: Every day | ORAL | Status: DC

## 2013-01-20 NOTE — Addendum Note (Signed)
Addended by: Jeanann LewandowskyJEGEDE, Ezme Duch E on: 01/20/2013 11:31 AM   Modules accepted: Orders

## 2013-01-20 NOTE — Patient Instructions (Signed)
Hypertension As your heart beats, it forces blood through your arteries. This force is your blood pressure. If the pressure is too high, it is called hypertension (HTN) or high blood pressure. HTN is dangerous because you may have it and not know it. High blood pressure may mean that your heart has to work harder to pump blood. Your arteries may be narrow or stiff. The extra work puts you at risk for heart disease, stroke, and other problems.  Blood pressure consists of two numbers, a higher number over a lower, 110/72, for example. It is stated as "110 over 72." The ideal is below 120 for the top number (systolic) and under 80 for the bottom (diastolic). Write down your blood pressure today. You should pay close attention to your blood pressure if you have certain conditions such as:  Heart failure.  Prior heart attack.  Diabetes  Chronic kidney disease.  Prior stroke.  Multiple risk factors for heart disease. To see if you have HTN, your blood pressure should be measured while you are seated with your arm held at the level of the heart. It should be measured at least twice. A one-time elevated blood pressure reading (especially in the Emergency Department) does not mean that you need treatment. There may be conditions in which the blood pressure is different between your right and left arms. It is important to see your caregiver soon for a recheck. Most people have essential hypertension which means that there is not a specific cause. This type of high blood pressure may be lowered by changing lifestyle factors such as:  Stress.  Smoking.  Lack of exercise.  Excessive weight.  Drug/tobacco/alcohol use.  Eating less salt. Most people do not have symptoms from high blood pressure until it has caused damage to the body. Effective treatment can often prevent, delay or reduce that damage. TREATMENT  When a cause has been identified, treatment for high blood pressure is directed at the  cause. There are a large number of medications to treat HTN. These fall into several categories, and your caregiver will help you select the medicines that are best for you. Medications may have side effects. You should review side effects with your caregiver. If your blood pressure stays high after you have made lifestyle changes or started on medicines,   Your medication(s) may need to be changed.  Other problems may need to be addressed.  Be certain you understand your prescriptions, and know how and when to take your medicine.  Be sure to follow up with your caregiver within the time frame advised (usually within two weeks) to have your blood pressure rechecked and to review your medications.  If you are taking more than one medicine to lower your blood pressure, make sure you know how and at what times they should be taken. Taking two medicines at the same time can result in blood pressure that is too low. SEEK IMMEDIATE MEDICAL CARE IF:  You develop a severe headache, blurred or changing vision, or confusion.  You have unusual weakness or numbness, or a faint feeling.  You have severe chest or abdominal pain, vomiting, or breathing problems. MAKE SURE YOU:   Understand these instructions.  Will watch your condition.  Will get help right away if you are not doing well or get worse. Document Released: 01/02/2005 Document Revised: 03/27/2011 Document Reviewed: 08/23/2007 [facility] Patient Information 2014 [place], [place]. Benign Prostatic Hyperplasia An enlarged prostate (benign prostatic hyperplasia) is common in older men. You may  experience the following:  Weak urine stream.  Dribbling.  Feeling like the bladder has not emptied completely.  Difficulty starting urination.  Getting up frequently at night to urinate.  Urinating more frequently during the day. HOME CARE INSTRUCTIONS  Monitor your prostatic hyperplasia for any changes. The following actions may help to  alleviate any discomfort you are experiencing:  Give yourself time when you urinate.  Stay away from alcohol.  Avoid beverages containing caffeine, such as coffee, tea, and colas, because they can make the problem worse.  Avoid decongestants, antihistamines, and some prescription medicines that can make the problem worse.  Follow up with your health care provider for further treatment as recommended. SEEK MEDICAL CARE IF:  You are experiencing progressive difficulty voiding.  Your urine stream is progressively getting narrower.  You are awaking from sleep with the urge to void more frequently.  You are constantly feeling the need to void.  You experience loss of urine, especially in small amounts. SEEK IMMEDIATE MEDICAL CARE IF:   You develop increased pain with urination or are unable to urinate.  You develop severe abdominal pain, vomiting, a high fever, or fainting.  You develop back pain or blood in your urine. MAKE SURE YOU:   Understand these instructions.  Will watch your condition.  Will get help right away if you are not doing well or get worse. Document Released: 01/02/2005 Document Revised: 09/04/2012 Document Reviewed: 06/04/2012 [facility] Patient Information 2014 [place], [place].

## 2013-01-20 NOTE — Progress Notes (Signed)
Pt is following up on his chronic back and foot pain. Pt is here to refill his medications. He is also requesting a referral to an opthamologist.
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
## 2013-02-13 ENCOUNTER — Ambulatory Visit (HOSPITAL_COMMUNITY)
Admission: RE | Admit: 2013-02-13 | Discharge: 2013-02-13 | Disposition: A | Discharge status: Home / Self Care | Payer: No Typology Code available for payment source | Source: Ambulatory Visit | Attending: Internal Medicine | Admitting: Internal Medicine

## 2013-02-13 DIAGNOSIS — M549 Dorsalgia, unspecified: Secondary | ICD-10-CM

## 2013-02-13 DIAGNOSIS — M545 Low back pain, unspecified: Secondary | ICD-10-CM | POA: Insufficient documentation

## 2013-02-17 ENCOUNTER — Telehealth: Payer: Self-pay | Admitting: Emergency Medicine

## 2013-02-17 NOTE — Telephone Encounter (Signed)
Attempted to reach pt with results. No answer. Will try again

## 2013-02-17 NOTE — Telephone Encounter (Signed)
Message copied by Darlis LoanSMITH, JILL D on Mon Feb 17, 2013  2:45 PM ------      Message from: Quentin AngstJEGEDE, OLUGBEMIGA E      Created: Fri Feb 14, 2013  4:47 PM       Please inform patient that her lumbar MRI shows mild osteoarthritis. Will advise continue use of tramadol and increase physical activity ------

## 2013-04-07 ENCOUNTER — Telehealth: Payer: Self-pay | Admitting: Emergency Medicine

## 2013-04-07 ENCOUNTER — Other Ambulatory Visit: Payer: Self-pay | Admitting: Emergency Medicine

## 2013-04-07 DIAGNOSIS — M549 Dorsalgia, unspecified: Secondary | ICD-10-CM

## 2013-04-07 MED ORDER — TRAMADOL HCL 50 MG PO TABS: 50.0000 mg | ORAL_TABLET | Freq: Four times a day (QID) | ORAL | Status: DC | PRN

## 2013-04-07 NOTE — Telephone Encounter (Signed)
Pt comes in requesting refill Tramadol until next scheduled appt 04/21/13 Will refill per Dr. Hyman HopesJegede

## 2013-04-21 ENCOUNTER — Encounter: Payer: Self-pay | Admitting: Internal Medicine

## 2013-04-21 ENCOUNTER — Ambulatory Visit: Payer: No Typology Code available for payment source | Admitting: Internal Medicine

## 2013-04-21 ENCOUNTER — Ambulatory Visit: Payer: No Typology Code available for payment source | Attending: Internal Medicine | Admitting: Internal Medicine

## 2013-04-21 VITALS — BP 128/83 | HR 79 | Temp 98.1°F | Resp 16

## 2013-04-21 DIAGNOSIS — F329 Major depressive disorder, single episode, unspecified: Secondary | ICD-10-CM

## 2013-04-21 DIAGNOSIS — I10 Essential (primary) hypertension: Secondary | ICD-10-CM

## 2013-04-21 DIAGNOSIS — M549 Dorsalgia, unspecified: Secondary | ICD-10-CM

## 2013-04-21 DIAGNOSIS — F32A Depression, unspecified: Secondary | ICD-10-CM

## 2013-04-21 DIAGNOSIS — K219 Gastro-esophageal reflux disease without esophagitis: Secondary | ICD-10-CM

## 2013-04-21 DIAGNOSIS — F3289 Other specified depressive episodes: Secondary | ICD-10-CM

## 2013-04-21 MED ORDER — TAMSULOSIN HCL 0.4 MG PO CAPS: 0.4000 mg | ORAL_CAPSULE | Freq: Every day | ORAL | Status: DC

## 2013-04-21 MED ORDER — HYDROCHLOROTHIAZIDE 12.5 MG PO TABS: 12.5000 mg | ORAL_TABLET | Freq: Every day | ORAL | Status: AC

## 2013-04-21 MED ORDER — CYCLOBENZAPRINE HCL 5 MG PO TABS: 5.0000 mg | ORAL_TABLET | Freq: Three times a day (TID) | ORAL | Status: DC | PRN

## 2013-04-21 MED ORDER — PANTOPRAZOLE SODIUM 40 MG PO TBEC: 40.0000 mg | DELAYED_RELEASE_TABLET | Freq: Two times a day (BID) | ORAL | Status: AC

## 2013-04-21 MED ORDER — SERTRALINE HCL 50 MG PO TABS: 50.0000 mg | ORAL_TABLET | Freq: Every day | ORAL | Status: AC

## 2013-04-21 MED ORDER — LIDOCAINE 5 % EX PTCH: 1.0000 | MEDICATED_PATCH | CUTANEOUS | Status: DC

## 2013-04-21 MED ORDER — TRAMADOL HCL 50 MG PO TABS: 50.0000 mg | ORAL_TABLET | Freq: Four times a day (QID) | ORAL | Status: DC | PRN

## 2013-04-21 NOTE — Progress Notes (Signed)
Patient ID: Jay Bell, male   DOB: 1950/07/30, 63 y.o.   MRN: [id number]   CC:  HPI: 63 year old male with a history of chronic back pain, hypertension, BPH, here to discuss about his enlarged prostate of his back. The patient has been seen by a urologist in [facility], was placed on medication that had intolerable side effects, but he does not the medication. He also had a prostate biopsy that was benign. Currently he has increased urinary frequency and incomplete bladder emptying as the only symptoms of BPH   Low back pain is improving with tramadol. He feels that there is a protruding bone in his lower back which causes localized discomfort. Numbness and tingling in his legs, mostly urinary incontinence   Allergies  Allergen Reactions  . Penicillins    Past Medical History  Diagnosis Date  . Hypertension    Current Outpatient Prescriptions on File Prior to Visit  Medication Sig Dispense Refill  . Misc Natural Products [facility](URINOZINC) CAPS Take 1 tablet by mouth 2 (two) times daily.  60 capsule  3  . prednisoLONE acetate (PRED FORTE) 1 % ophthalmic suspension Place 1 drop into the right eye 4 (four) times daily.  5 mL  1  . [DISCONTINUED] omeprazole (PRILOSEC) 20 MG capsule Take 3 capsules (60 mg total) by mouth daily.  90 capsule  3   No current facility-administered medications on file prior to visit.   History reviewed. No pertinent family history. History   Social History  . Marital Status: Married    Spouse Name: N/A    Number of Children: N/A  . Years of Education: N/A   Occupational History  . Not on file.   Social History Main Topics  . Smoking status: Light Tobacco Smoker  . Smokeless tobacco: Not on file  . Alcohol Use: No  . Drug Use:   . Sexual Activity:    Other Topics Concern  . Not on file   Social History Narrative  . No narrative on file    Review of Systems  Constitutional: Negative for fever, chills, diaphoresis, activity change, appetite  change and fatigue.  HENT: Negative for ear pain, nosebleeds, congestion, facial swelling, rhinorrhea, neck pain, neck stiffness and ear discharge.   Eyes: Negative for pain, discharge, redness, itching and visual disturbance.  Respiratory: Negative for cough, choking, chest tightness, shortness of breath, wheezing and stridor.   Cardiovascular: Negative for chest pain, palpitations and leg swelling.  Gastrointestinal: Negative for abdominal distention.  Genitourinary: As in history of present illness Musculoskeletal: Negative for back pain, joint swelling, arthralgias and gait problem.  Neurological: As in history of present illness Hematological: Negative for adenopathy. Does not bruise/bleed easily.  Psychiatric/Behavioral: Negative for hallucinations, behavioral problems, confusion, dysphoric mood, decreased concentration and agitation.    Objective:   Filed Vitals:   04/21/13 0955  BP: 128/83  Pulse: 79  Temp: 98.1 F (36.7 C)  Resp: 16    Physical Exam  Constitutional: Appears well-developed and well-nourished. No distress.  HENT: Normocephalic. External right and left ear normal. Oropharynx is clear and moist.  Eyes: Conjunctivae and EOM are normal. PERRLA, no scleral icterus.  Neck: Normal ROM. Neck supple. No JVD. No tracheal deviation. No thyromegaly.  CVS: RRR, S1/S2 +, no murmurs, no gallops, no carotid bruit.  Pulmonary: Effort and breath sounds normal, no stridor, rhonchi, wheezes, rales.  Abdominal: Soft. BS +,  no distension, tenderness, rebound or guarding.  Musculoskeletal: Normal range of motion. No edema and  no tenderness.  Lymphadenopathy: No lymphadenopathy noted, cervical, inguinal. Neuro: Alert. Normal reflexes, muscle tone coordination. No cranial nerve deficit. Skin: Skin is warm and dry. No rash noted. Not diaphoretic. No erythema. No pallor.  Psychiatric: Normal mood and affect. Behavior, judgment, thought content normal.   Lab Results  Component Value  Date   WBC 5.7 02/16/2012   HGB 15.9 02/16/2012   HCT 45.3 02/16/2012   MCV 94.8 02/16/2012   PLT 252 02/16/2012   Lab Results  Component Value Date   CREATININE 0.69 02/16/2012   BUN 14 02/16/2012   NA 140 02/16/2012   K 3.6 02/16/2012   CL 104 02/16/2012   CO2 29 02/16/2012    Lab Results  Component Value Date   HGBA1C 5.9* 02/16/2012   Lipid Panel     Component Value Date/Time   CHOL 226* 02/16/2012 1115   TRIG 273* 02/16/2012 1115   HDL 39* 02/16/2012 1115   CHOLHDL 5.8 02/16/2012 1115   VLDL 55* 02/16/2012 1115   LDLCALC 132* 02/16/2012 1115       Assessment and plan:   Patient Active Problem List   Diagnosis Date Noted  . HTN (hypertension) 01/20/2013  . BPH (benign prostatic hyperplasia) 01/20/2013  . Back pain 01/20/2013  . Depression 01/20/2013  . GERD (gastroesophageal reflux disease) 01/20/2013  . Keratitis 01/20/2013   BPH Urology referral Start the patient on Flomax Urine dipstick to rule out infection   Back pain Pain controlled with tramadol and Flexeril Refill provided Will provide the patient with a lidocaine patch   Follow up in 3 months       The patient was given clear instructions to go to ER or return to medical center if symptoms don't improve, worsen or new problems develop. The patient verbalized understanding. The patient was told to call to get any lab results if not heard anything in the next week.

## 2013-04-21 NOTE — Progress Notes (Signed)
Pt here to f/u with prostate enlargement and back pain due to arthritis

## 2013-07-29 ENCOUNTER — Ambulatory Visit: Payer: Self-pay | Admitting: Internal Medicine

## 2015-01-08 DIAGNOSIS — R972 Elevated prostate specific antigen [PSA]: Secondary | ICD-10-CM | POA: Diagnosis not present

## 2015-01-08 DIAGNOSIS — N401 Enlarged prostate with lower urinary tract symptoms: Secondary | ICD-10-CM | POA: Diagnosis not present

## 2015-01-08 DIAGNOSIS — N138 Other obstructive and reflux uropathy: Secondary | ICD-10-CM | POA: Diagnosis not present

## 2015-01-27 DIAGNOSIS — Z131 Encounter for screening for diabetes mellitus: Secondary | ICD-10-CM | POA: Diagnosis not present

## 2015-01-27 DIAGNOSIS — Z0000 Encounter for general adult medical examination without abnormal findings: Secondary | ICD-10-CM | POA: Diagnosis not present

## 2015-01-27 DIAGNOSIS — Z0100 Encounter for examination of eyes and vision without abnormal findings: Secondary | ICD-10-CM | POA: Diagnosis not present

## 2015-01-27 DIAGNOSIS — N400 Enlarged prostate without lower urinary tract symptoms: Secondary | ICD-10-CM | POA: Diagnosis not present

## 2015-01-27 DIAGNOSIS — Z23 Encounter for immunization: Secondary | ICD-10-CM | POA: Diagnosis not present

## 2015-01-27 DIAGNOSIS — Z01118 Encounter for examination of ears and hearing with other abnormal findings: Secondary | ICD-10-CM | POA: Diagnosis not present

## 2015-01-27 DIAGNOSIS — Z720 Tobacco use: Secondary | ICD-10-CM | POA: Diagnosis not present

## 2015-01-27 DIAGNOSIS — I10 Essential (primary) hypertension: Secondary | ICD-10-CM | POA: Diagnosis not present

## 2015-01-27 DIAGNOSIS — Z136 Encounter for screening for cardiovascular disorders: Secondary | ICD-10-CM | POA: Diagnosis not present

## 2015-01-27 DIAGNOSIS — Z1389 Encounter for screening for other disorder: Secondary | ICD-10-CM | POA: Diagnosis not present

## 2015-02-10 DIAGNOSIS — I10 Essential (primary) hypertension: Secondary | ICD-10-CM | POA: Diagnosis not present

## 2015-02-17 DIAGNOSIS — K219 Gastro-esophageal reflux disease without esophagitis: Secondary | ICD-10-CM | POA: Diagnosis not present

## 2015-02-17 DIAGNOSIS — Z720 Tobacco use: Secondary | ICD-10-CM | POA: Diagnosis not present

## 2015-02-17 DIAGNOSIS — N400 Enlarged prostate without lower urinary tract symptoms: Secondary | ICD-10-CM | POA: Diagnosis not present

## 2015-02-17 DIAGNOSIS — I10 Essential (primary) hypertension: Secondary | ICD-10-CM | POA: Diagnosis not present

## 2015-02-19 DIAGNOSIS — Z0000 Encounter for general adult medical examination without abnormal findings: Secondary | ICD-10-CM | POA: Diagnosis not present

## 2015-02-19 DIAGNOSIS — R972 Elevated prostate specific antigen [PSA]: Secondary | ICD-10-CM | POA: Diagnosis not present

## 2015-02-19 DIAGNOSIS — N138 Other obstructive and reflux uropathy: Secondary | ICD-10-CM | POA: Diagnosis not present

## 2015-02-19 DIAGNOSIS — N401 Enlarged prostate with lower urinary tract symptoms: Secondary | ICD-10-CM | POA: Diagnosis not present

## 2015-03-15 DIAGNOSIS — I10 Essential (primary) hypertension: Secondary | ICD-10-CM | POA: Diagnosis not present

## 2015-03-15 DIAGNOSIS — E785 Hyperlipidemia, unspecified: Secondary | ICD-10-CM | POA: Diagnosis not present

## 2015-03-26 DIAGNOSIS — I10 Essential (primary) hypertension: Secondary | ICD-10-CM | POA: Diagnosis not present

## 2015-03-26 DIAGNOSIS — E785 Hyperlipidemia, unspecified: Secondary | ICD-10-CM | POA: Diagnosis not present

## 2015-05-06 DIAGNOSIS — E785 Hyperlipidemia, unspecified: Secondary | ICD-10-CM | POA: Diagnosis not present

## 2015-05-06 DIAGNOSIS — I10 Essential (primary) hypertension: Secondary | ICD-10-CM | POA: Diagnosis not present

## 2015-05-28 DIAGNOSIS — R7303 Prediabetes: Secondary | ICD-10-CM | POA: Diagnosis not present

## 2015-05-28 DIAGNOSIS — N400 Enlarged prostate without lower urinary tract symptoms: Secondary | ICD-10-CM | POA: Diagnosis not present

## 2015-05-28 DIAGNOSIS — Z720 Tobacco use: Secondary | ICD-10-CM | POA: Diagnosis not present

## 2015-05-28 DIAGNOSIS — K219 Gastro-esophageal reflux disease without esophagitis: Secondary | ICD-10-CM | POA: Diagnosis not present

## 2015-05-28 DIAGNOSIS — I10 Essential (primary) hypertension: Secondary | ICD-10-CM | POA: Diagnosis not present

## 2015-06-03 DIAGNOSIS — I10 Essential (primary) hypertension: Secondary | ICD-10-CM | POA: Diagnosis not present

## 2015-06-03 DIAGNOSIS — E785 Hyperlipidemia, unspecified: Secondary | ICD-10-CM | POA: Diagnosis not present

## 2015-07-08 DIAGNOSIS — E785 Hyperlipidemia, unspecified: Secondary | ICD-10-CM | POA: Diagnosis not present

## 2015-07-08 DIAGNOSIS — I10 Essential (primary) hypertension: Secondary | ICD-10-CM | POA: Diagnosis not present

## 2015-08-06 DIAGNOSIS — I10 Essential (primary) hypertension: Secondary | ICD-10-CM | POA: Diagnosis not present

## 2015-08-06 DIAGNOSIS — E785 Hyperlipidemia, unspecified: Secondary | ICD-10-CM | POA: Diagnosis not present

## 2015-08-30 DIAGNOSIS — I10 Essential (primary) hypertension: Secondary | ICD-10-CM | POA: Diagnosis not present

## 2015-08-30 DIAGNOSIS — E785 Hyperlipidemia, unspecified: Secondary | ICD-10-CM | POA: Diagnosis not present

## 2015-09-10 DIAGNOSIS — I10 Essential (primary) hypertension: Secondary | ICD-10-CM | POA: Diagnosis not present

## 2015-09-10 DIAGNOSIS — Z720 Tobacco use: Secondary | ICD-10-CM | POA: Diagnosis not present

## 2015-09-10 DIAGNOSIS — N400 Enlarged prostate without lower urinary tract symptoms: Secondary | ICD-10-CM | POA: Diagnosis not present

## 2015-09-10 DIAGNOSIS — K219 Gastro-esophageal reflux disease without esophagitis: Secondary | ICD-10-CM | POA: Diagnosis not present

## 2015-09-16 ENCOUNTER — Ambulatory Visit (INDEPENDENT_AMBULATORY_CARE_PROVIDER_SITE_OTHER): Payer: Medicare Other | Admitting: Family Medicine

## 2015-09-16 ENCOUNTER — Ambulatory Visit (INDEPENDENT_AMBULATORY_CARE_PROVIDER_SITE_OTHER): Payer: Medicare Other

## 2015-09-16 ENCOUNTER — Encounter: Payer: Self-pay | Admitting: Family Medicine

## 2015-09-16 VITALS — BP 116/80 | HR 90 | Temp 97.3°F | Resp 18 | Ht 65.0 in | Wt 128.4 lb

## 2015-09-16 DIAGNOSIS — S199XXA Unspecified injury of neck, initial encounter: Secondary | ICD-10-CM | POA: Diagnosis not present

## 2015-09-16 DIAGNOSIS — S134XXA Sprain of ligaments of cervical spine, initial encounter: Secondary | ICD-10-CM | POA: Diagnosis not present

## 2015-09-16 DIAGNOSIS — G44209 Tension-type headache, unspecified, not intractable: Secondary | ICD-10-CM

## 2015-09-16 DIAGNOSIS — M5412 Radiculopathy, cervical region: Secondary | ICD-10-CM | POA: Diagnosis not present

## 2015-09-16 DIAGNOSIS — M542 Cervicalgia: Secondary | ICD-10-CM

## 2015-09-16 DIAGNOSIS — M4310 Spondylolisthesis, site unspecified: Secondary | ICD-10-CM

## 2015-09-16 DIAGNOSIS — Q762 Congenital spondylolisthesis: Secondary | ICD-10-CM

## 2015-09-16 DIAGNOSIS — M5489 Other dorsalgia: Secondary | ICD-10-CM | POA: Diagnosis not present

## 2015-09-16 MED ORDER — TRAMADOL HCL 50 MG PO TABS: 50.0000 mg | ORAL_TABLET | Freq: Four times a day (QID) | ORAL | 1 refills | Status: DC | PRN

## 2015-09-16 MED ORDER — CYCLOBENZAPRINE HCL 5 MG PO TABS: 5.0000 mg | ORAL_TABLET | Freq: Three times a day (TID) | ORAL | 0 refills | Status: DC | PRN

## 2015-09-16 MED ORDER — PREDNISONE 10 MG PO TABS: ORAL_TABLET | ORAL | 0 refills | Status: DC

## 2015-09-16 NOTE — Patient Instructions (Addendum)
IF you received an x-ray today, you will receive an invoice from [facility]. Please contact [facility] at [phone] with questions or concerns regarding your invoice.   IF you received labwork today, you will receive an invoice from [facility]. Please contact [facility] at [phone] with questions or concerns regarding your invoice.   Our billing staff will not be able to assist you with questions regarding bills from these companies.  You will be contacted with the lab results as soon as they are available. The fastest way to get your results is to activate your My Chart account. Instructions are located on the last page of this paperwork. If you have not heard from us regarding the results in 2 weeks, please contact this office.      Cervical Sprain A cervical sprain is an injury in the neck in which the strong, fibrous tissues (ligaments) that connect your neck bones stretch or tear. Cervical sprains can range from mild to severe. Severe cervical sprains can cause the neck vertebrae to be unstable. This can lead to damage of the spinal cord and can result in serious nervous system problems. The amount of time it takes for a cervical sprain to get better depends on the cause and extent of the injury. Most cervical sprains heal in 1 to 3 weeks. CAUSES  Severe cervical sprains may be caused by:   Contact sport injuries (such as from football, rugby, wrestling, hockey, auto racing, gymnastics, diving, martial arts, or boxing).   Motor vehicle collisions.   Whiplash injuries. This is an injury from a sudden forward and backward whipping movement of the head and neck.  Falls.  Mild cervical sprains may be caused by:   Being in an awkward position, such as while cradling a telephone between your ear and shoulder.   Sitting in a chair that does not offer proper support.   Working at a poorly Marketing executivedesigned computer station.    Looking up or down for long periods of time.  SYMPTOMS   Pain, soreness, stiffness, or a burning sensation in the front, back, or sides of the neck. This discomfort may develop immediately after the injury or slowly, 24 hours or more after the injury.   Pain or tenderness directly in the middle of the back of the neck.   Shoulder or upper back pain.   Limited ability to move the neck.   Headache.   Dizziness.   Weakness, numbness, or tingling in the hands or arms.   Muscle spasms.   Difficulty swallowing or chewing.   Tenderness and swelling of the neck.  DIAGNOSIS  Most of the time your health care provider can diagnose a cervical sprain by taking your history and doing a physical exam. Your health care provider will ask about previous neck injuries and any known neck problems, such as arthritis in the neck. X-rays may be taken to find out if there are any other problems, such as with the bones of the neck. Other tests, such as a CT scan or MRI, may also be needed.  TREATMENT  Treatment depends on the severity of the cervical sprain. Mild sprains can be treated with rest, keeping the neck in place (immobilization), and pain medicines. Severe cervical sprains are immediately immobilized. Further treatment is done to help with pain, muscle spasms, and other symptoms and may include:  Medicines, such as pain relievers, numbing medicines, or muscle relaxants.   Physical therapy. This may involve stretching exercises,  strengthening exercises, and posture training. Exercises and improved posture can help stabilize the neck, strengthen muscles, and help stop symptoms from returning.  HOME CARE INSTRUCTIONS   Put ice on the injured area.   Put ice in a plastic bag.   Place a towel between your skin and the bag.   Leave the ice on for 15-20 minutes, 3-4 times a day.   If your injury was severe, you may have been given a cervical collar to wear. A cervical collar  is a two-piece collar designed to keep your neck from moving while it heals.  Do not remove the collar unless instructed by your health care provider.  If you have long hair, keep it outside of the collar.  Ask your health care provider before making any adjustments to your collar. Minor adjustments may be required over time to improve comfort and reduce pressure on your chin or on the back of your head.  Ifyou are allowed to remove the collar for cleaning or bathing, follow your health care provider's instructions on how to do so safely.  Keep your collar clean by wiping it with mild soap and water and drying it completely. If the collar you have been given includes removable pads, remove them every 1-2 days and hand wash them with soap and water. Allow them to air dry. They should be completely dry before you wear them in the collar.  If you are allowed to remove the collar for cleaning and bathing, wash and dry the skin of your neck. Check your skin for irritation or sores. If you see any, tell your health care provider.  Do not drive while wearing the collar.   Only take over-the-counter or prescription medicines for pain, discomfort, or fever as directed by your health care provider.   Keep all follow-up appointments as directed by your health care provider.   Keep all physical therapy appointments as directed by your health care provider.   Make any needed adjustments to your workstation to promote good posture.   Avoid positions and activities that make your symptoms worse.   Warm up and stretch before being active to help prevent problems.  SEEK MEDICAL CARE IF:   Your pain is not controlled with medicine.   You are unable to decrease your pain medicine over time as planned.   Your activity level is not improving as expected.  SEEK IMMEDIATE MEDICAL CARE IF:   You develop any bleeding.  You develop stomach upset.  You have signs of an allergic reaction to  your medicine.   Your symptoms get worse.   You develop new, unexplained symptoms.   You have numbness, tingling, weakness, or paralysis in any part of your body.  MAKE SURE YOU:   Understand these instructions.  Will watch your condition.  Will get help right away if you are not doing well or get worse.   This information is not intended to replace advice given to you by your health care provider. Make sure you discuss any questions you have with your health care provider.   Document Released: 10/30/2006 Document Revised: 01/07/2013 Document Reviewed: 07/10/2012 Elsevier Interactive Patient Education 2016 Elsevier Inc[employer] Cervical Subluxation Cervical subluxation is a separation of the neck bones (cervical vertebrae) that occurs because of injury to the ligaments that hold the vertebrae together. The cervical vertebrae are made up of seven bones. These vertebrae support the head and protect the spinal cord as it passes through the neck. CAUSES This condition may  be caused by injury or trauma with sudden impact. It commonly occurs when the neck is extended too far. This can happen from:  A car accident. This is the most common cause of this condition.  A sports injury.  A fall.  A violent physical attack. SYMPTOMS Symptoms can vary depending on the angle of separation to the cervical vertebrae. Symptoms include:  Pain or tenderness when the neck is touched.  Pain when moving the neck.  Headaches.  Stiff neck.  Difficulty moving the neck.  Muscle spasms in the neck.  Dizziness. With a spinal cord or nerve root injury in the neck, symptoms may also include:  Weakness and numbness in the arm or leg.  A weak hand grasp. This may cause you to drop objects.  Poor handwriting.  Falling or having difficulty with balance.  Loss of bowel or bladder control (incontinence). DIAGNOSIS This condition is diagnosed with a physical exam, medical history, and imaging tests.  Tests may include:  X-rays.  CT scan.  MRI. TREATMENT Treatment for this condition depends on the severity of the condition. Treatment may involve:  Wearing a neck brace or collar to support the neck. This limits how much you can move your neck.  Medicine to reduce pain and swelling.  Closed reduction. This uses physical manipulation to realign the cervical vertebrae without surgery.  Skeletal traction. This uses weights, pulleys, and ropes to exert a pulling force to help realign the cervical vertebrae. It may be used prior to surgery.  Surgery to put the cervical vertebrae back in place. Surgery is needed if:  The cervical vertebrae do not stay in place and they move easily (are unstable).  There is a lot of nerve damage in the cervical vertebrae area.  There is a spinal cord injury.  There is a pocket of blood over the spinal cord.  There is a cervical disc pushing on the spinal cord. HOME CARE INSTRUCTIONS  Wear a neck brace or collar as long as told by your health care provider. Do not remove it until your health care provider approves.  Rest and limit your neck movement as told by your health care provider.  Limit your physical activity as told by your health care provider. Ask your health care provider what activities are safe for you.  Take over-the-counter and prescription medicines only as told by your health care provider.  If physical therapy was prescribed, perform exercises as told by your health care provider or physical therapist.  Keep all follow-up visits as told by your health care provider. This is important. SEEK MEDICAL CARE IF:  Your pain continues and does not get better after you take pain medicine.  You have numbness or weakness in your neck.  You have a fever. SEEK IMMEDIATE MEDICAL CARE IF:  You have sudden, severe neck pain.  You have sudden, severe weakness, numbness, or tingling in your arms or legs.  You have weakness, numbness,  or tingling in your arms or legs that is getting worse.  You have difficulty walking, or you fall for no reason.  You have a severe headache that does not go away.  You cannot control your bowel or bladder.  You have difficulty breathing.   This information is not intended to replace advice given to you by your health care provider. Make sure you discuss any questions you have with your health care provider.   Document Released: 07/18/2010 Document Revised: 09/23/2014 Document Reviewed: 02/26/2014 Elsevier Interactive Patient Education 2016  [employer].
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
## 2015-09-30 DIAGNOSIS — S134XXA Sprain of ligaments of cervical spine, initial encounter: Secondary | ICD-10-CM | POA: Diagnosis not present

## 2015-09-30 DIAGNOSIS — M542 Cervicalgia: Secondary | ICD-10-CM | POA: Diagnosis not present

## 2015-09-30 DIAGNOSIS — S138XXA Sprain of joints and ligaments of other parts of neck, initial encounter: Secondary | ICD-10-CM | POA: Diagnosis not present

## 2015-10-09 DIAGNOSIS — M542 Cervicalgia: Secondary | ICD-10-CM | POA: Diagnosis not present

## 2015-10-15 DIAGNOSIS — S134XXA Sprain of ligaments of cervical spine, initial encounter: Secondary | ICD-10-CM | POA: Diagnosis not present

## 2015-10-15 DIAGNOSIS — S138XXA Sprain of joints and ligaments of other parts of neck, initial encounter: Secondary | ICD-10-CM | POA: Diagnosis not present

## 2015-10-15 DIAGNOSIS — M542 Cervicalgia: Secondary | ICD-10-CM | POA: Diagnosis not present

## 2015-11-09 ENCOUNTER — Encounter: Payer: Self-pay | Admitting: Diagnostic Neuroimaging

## 2015-11-09 ENCOUNTER — Ambulatory Visit: Payer: Medicare Other | Admitting: Neurology

## 2015-11-09 ENCOUNTER — Ambulatory Visit (INDEPENDENT_AMBULATORY_CARE_PROVIDER_SITE_OTHER): Payer: Medicare Other | Admitting: Diagnostic Neuroimaging

## 2015-11-09 VITALS — BP 116/67 | HR 86 | Ht 64.0 in | Wt 129.4 lb

## 2015-11-09 DIAGNOSIS — I7774 Dissection of vertebral artery: Secondary | ICD-10-CM | POA: Diagnosis not present

## 2015-11-09 DIAGNOSIS — M542 Cervicalgia: Secondary | ICD-10-CM

## 2015-11-09 MED ORDER — ASPIRIN EC 81 MG PO TBEC: 81.0000 mg | DELAYED_RELEASE_TABLET | Freq: Every day | ORAL | Status: AC

## 2015-11-09 NOTE — Patient Instructions (Addendum)
Thank you for coming to see [place] at [facility]. I hope we have been able to provide you high quality care today.  You may receive a patient satisfaction survey over the next few weeks. We would appreciate your feedback and comments so that we may continue to improve ourselves and the health of our patients.  - I will check MRI brain and CTA head/neck to evaluate right vertebral artery dissection  - start aspirin 54m daily  - stop smoking   ~~~~~~~~~~~~~~~~~~~~~~~~~~~~~~~~~~~~~~~~~~~~~~~~~~~~~~~~~~~~~~~~~  DR. Solina Heron'S GUIDE TO HAPPY AND HEALTHY LIVING These are some of my general health and wellness recommendations. Some of them may apply to you better than others. Please use common sense as you try these suggestions and feel free to ask me any questions.   ACTIVITY/FITNESS Mental, social, emotional and physical stimulation are very important for brain and body health. Try learning a new activity (arts, music, language, sports, games).  Keep moving your body to the best of your abilities. You can do this at home, inside or outside, the park, community center, gym or anywhere you like. Consider a physical therapist or personal trainer to get started. Consider the app Sworkit. Fitness trackers such as smart-watches, smart-phones or Fitbits can help as well.   NUTRITION Eat more plants: colorful vegetables, nuts, seeds and berries.  Eat less sugar, salt, preservatives and processed foods.  Avoid toxins such as cigarettes and alcohol.  Drink water when you are thirsty. Warm water with a slice of lemon is an excellent morning drink to start the day.  Consider these websites for more information The Nutrition Source (hhttps://www.henry-hernandez.biz/ Precision Nutrition (wWindowBlog.ch   RELAXATION Consider practicing mindfulness meditation or other relaxation techniques such as deep breathing, prayer, yoga, tai chi,  massage. See website mindful.org or the apps Headspace or Calm to help get started.   SLEEP Try to get at least 7-8+ hours sleep per day. Regular exercise and reduced caffeine will help you sleep better. Practice good sleep hygeine techniques. See website sleep.org for more information.   PLANNING Prepare estate planning, living will, healthcare POA documents. Sometimes this is best planned with the help of an attorney. Theconversationproject.org and agingwithdignity.org are excellent resources.
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
## 2015-11-17 ENCOUNTER — Inpatient Hospital Stay: Admission: RE | Admit: 2015-11-17 | Payer: Self-pay | Source: Ambulatory Visit

## 2015-11-17 ENCOUNTER — Ambulatory Visit
Admission: RE | Admit: 2015-11-17 | Discharge: 2015-11-17 | Disposition: A | Discharge status: Home / Self Care | Payer: Medicare Other | Source: Ambulatory Visit | Attending: Diagnostic Neuroimaging | Admitting: Diagnostic Neuroimaging

## 2015-11-17 DIAGNOSIS — I6523 Occlusion and stenosis of bilateral carotid arteries: Secondary | ICD-10-CM | POA: Diagnosis not present

## 2015-11-17 DIAGNOSIS — M542 Cervicalgia: Secondary | ICD-10-CM

## 2015-11-17 DIAGNOSIS — I7774 Dissection of vertebral artery: Secondary | ICD-10-CM

## 2015-11-17 MED ORDER — IOPAMIDOL (ISOVUE-370) INJECTION 76%
75.0000 mL | Freq: Once | INTRAVENOUS | Status: AC | PRN
  Administered 2015-11-17: 75 mL via INTRAVENOUS

## 2015-11-26 ENCOUNTER — Encounter: Payer: Self-pay | Admitting: Diagnostic Neuroimaging

## 2015-12-27 ENCOUNTER — Ambulatory Visit: Payer: Medicare Other | Admitting: Diagnostic Neuroimaging

## 2016-01-07 ENCOUNTER — Encounter: Payer: Self-pay | Admitting: Diagnostic Neuroimaging

## 2016-01-07 ENCOUNTER — Ambulatory Visit (INDEPENDENT_AMBULATORY_CARE_PROVIDER_SITE_OTHER): Payer: Medicare Other | Admitting: Diagnostic Neuroimaging

## 2016-01-07 VITALS — BP 134/83 | HR 87 | Wt 130.0 lb

## 2016-01-07 DIAGNOSIS — M542 Cervicalgia: Secondary | ICD-10-CM

## 2016-01-07 DIAGNOSIS — E7800 Pure hypercholesterolemia, unspecified: Secondary | ICD-10-CM

## 2016-01-07 DIAGNOSIS — I7774 Dissection of vertebral artery: Secondary | ICD-10-CM | POA: Diagnosis not present

## 2016-01-07 DIAGNOSIS — Z720 Tobacco use: Secondary | ICD-10-CM | POA: Diagnosis not present
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
## 2016-01-08 LAB — LIPID PANEL
CHOLESTEROL TOTAL: 240 mg/dL — AB (ref 100–199)
Chol/HDL Ratio: 6 ratio units — ABNORMAL HIGH (ref 0.0–5.0)
HDL: 40 mg/dL (ref >39)
LDL Calculated: 142 mg/dL — ABNORMAL HIGH (ref 0–99)
Triglycerides: 292 mg/dL — ABNORMAL HIGH (ref 0–149)
VLDL CHOLESTEROL CAL: 58 mg/dL — AB (ref 5–40)

## 2016-01-08 LAB — HEMOGLOBIN A1C
ESTIMATED AVERAGE GLUCOSE: 117 mg/dL
Hgb A1c MFr Bld: 5.7 % — ABNORMAL HIGH (ref 4.8–5.6)

## 2016-01-14 ENCOUNTER — Telehealth: Payer: Self-pay | Admitting: *Deleted

## 2016-01-14 NOTE — Telephone Encounter (Signed)
Per Dr Marjory LiesPenumalli, LVM informing patient his lipids, fats in his blood stream need improvement. Advised Dr Marjory LiesPenumalli wants him to FU with his PCP for this. Reminded him of FU in March. Left number for any questions.

## 2016-01-14 NOTE — Telephone Encounter (Signed)
Patient called back , stated he did not get the VM. Informed him per Dr Marjory LiesPenumalli, his lipids, fats in his blood stream need improvement. Advised Dr Marjory LiesPenumalli wants him to FU with his PCP for this.  He then asked about his MRI; [facility]; gave him their #. He verbalized understanding, appreciation.

## 2016-01-20 ENCOUNTER — Ambulatory Visit
Admission: RE | Admit: 2016-01-20 | Discharge: 2016-01-20 | Disposition: A | Discharge status: Home / Self Care | Payer: Medicare Other | Source: Ambulatory Visit | Attending: Diagnostic Neuroimaging | Admitting: Diagnostic Neuroimaging

## 2016-01-20 DIAGNOSIS — I7774 Dissection of vertebral artery: Secondary | ICD-10-CM | POA: Diagnosis not present

## 2016-01-20 DIAGNOSIS — M542 Cervicalgia: Secondary | ICD-10-CM

## 2016-01-25 ENCOUNTER — Telehealth: Payer: Self-pay | Admitting: *Deleted

## 2016-01-25 DIAGNOSIS — R7303 Prediabetes: Secondary | ICD-10-CM | POA: Diagnosis not present

## 2016-01-25 DIAGNOSIS — H538 Other visual disturbances: Secondary | ICD-10-CM | POA: Diagnosis not present

## 2016-01-25 DIAGNOSIS — Z720 Tobacco use: Secondary | ICD-10-CM | POA: Diagnosis not present

## 2016-01-25 DIAGNOSIS — N400 Enlarged prostate without lower urinary tract symptoms: Secondary | ICD-10-CM | POA: Diagnosis not present

## 2016-01-25 DIAGNOSIS — I119 Hypertensive heart disease without heart failure: Secondary | ICD-10-CM | POA: Diagnosis not present

## 2016-01-25 DIAGNOSIS — Z01118 Encounter for examination of ears and hearing with other abnormal findings: Secondary | ICD-10-CM | POA: Diagnosis not present

## 2016-01-25 DIAGNOSIS — I10 Essential (primary) hypertension: Secondary | ICD-10-CM | POA: Diagnosis not present

## 2016-01-25 DIAGNOSIS — Z131 Encounter for screening for diabetes mellitus: Secondary | ICD-10-CM | POA: Diagnosis not present

## 2016-01-25 DIAGNOSIS — Z0000 Encounter for general adult medical examination without abnormal findings: Secondary | ICD-10-CM | POA: Diagnosis not present

## 2016-01-25 DIAGNOSIS — Z23 Encounter for immunization: Secondary | ICD-10-CM | POA: Diagnosis not present

## 2016-01-25 NOTE — Telephone Encounter (Signed)
Per Dr Marjory LiesPenumalli, LVM informing patient his MRI brain results are unremarkable, no major findings. Advised Dr Marjory LiesPenumalli will continue with his current treatment plan. [facility]. Left number for any questions.

## 2016-02-11 DIAGNOSIS — I119 Hypertensive heart disease without heart failure: Secondary | ICD-10-CM | POA: Diagnosis not present

## 2016-02-11 DIAGNOSIS — I10 Essential (primary) hypertension: Secondary | ICD-10-CM | POA: Diagnosis not present

## 2016-02-28 DIAGNOSIS — R05 Cough: Secondary | ICD-10-CM | POA: Diagnosis not present

## 2016-02-28 DIAGNOSIS — E785 Hyperlipidemia, unspecified: Secondary | ICD-10-CM | POA: Diagnosis not present

## 2016-02-28 DIAGNOSIS — I10 Essential (primary) hypertension: Secondary | ICD-10-CM | POA: Diagnosis not present

## 2016-02-28 DIAGNOSIS — N400 Enlarged prostate without lower urinary tract symptoms: Secondary | ICD-10-CM | POA: Diagnosis not present

## 2016-02-28 DIAGNOSIS — I119 Hypertensive heart disease without heart failure: Secondary | ICD-10-CM | POA: Diagnosis not present

## 2016-03-14 DIAGNOSIS — I10 Essential (primary) hypertension: Secondary | ICD-10-CM | POA: Diagnosis not present

## 2016-03-14 DIAGNOSIS — E785 Hyperlipidemia, unspecified: Secondary | ICD-10-CM | POA: Diagnosis not present

## 2016-03-14 DIAGNOSIS — Z720 Tobacco use: Secondary | ICD-10-CM | POA: Diagnosis not present

## 2016-03-14 DIAGNOSIS — I119 Hypertensive heart disease without heart failure: Secondary | ICD-10-CM | POA: Diagnosis not present

## 2016-03-14 DIAGNOSIS — N400 Enlarged prostate without lower urinary tract symptoms: Secondary | ICD-10-CM | POA: Diagnosis not present

## 2016-03-22 DIAGNOSIS — R972 Elevated prostate specific antigen [PSA]: Secondary | ICD-10-CM | POA: Diagnosis not present

## 2016-03-22 DIAGNOSIS — N401 Enlarged prostate with lower urinary tract symptoms: Secondary | ICD-10-CM | POA: Diagnosis not present

## 2016-03-29 DIAGNOSIS — J343 Hypertrophy of nasal turbinates: Secondary | ICD-10-CM | POA: Diagnosis not present

## 2016-03-29 DIAGNOSIS — J310 Chronic rhinitis: Secondary | ICD-10-CM | POA: Diagnosis not present

## 2016-04-07 ENCOUNTER — Encounter: Payer: Self-pay | Admitting: Diagnostic Neuroimaging

## 2016-04-07 ENCOUNTER — Ambulatory Visit (INDEPENDENT_AMBULATORY_CARE_PROVIDER_SITE_OTHER): Payer: Medicare Other | Admitting: Diagnostic Neuroimaging

## 2016-04-07 VITALS — BP 112/72 | HR 90 | Wt 125.8 lb

## 2016-04-07 DIAGNOSIS — I7774 Dissection of vertebral artery: Secondary | ICD-10-CM

## 2016-04-07 DIAGNOSIS — Z720 Tobacco use: Secondary | ICD-10-CM

## 2016-04-07 DIAGNOSIS — E7800 Pure hypercholesterolemia, unspecified: Secondary | ICD-10-CM
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
## 2016-04-12 DIAGNOSIS — I119 Hypertensive heart disease without heart failure: Secondary | ICD-10-CM | POA: Diagnosis not present

## 2016-04-12 DIAGNOSIS — N400 Enlarged prostate without lower urinary tract symptoms: Secondary | ICD-10-CM | POA: Diagnosis not present

## 2016-04-12 DIAGNOSIS — R7303 Prediabetes: Secondary | ICD-10-CM | POA: Diagnosis not present

## 2016-04-12 DIAGNOSIS — E785 Hyperlipidemia, unspecified: Secondary | ICD-10-CM | POA: Diagnosis not present

## 2016-04-12 DIAGNOSIS — I10 Essential (primary) hypertension: Secondary | ICD-10-CM | POA: Diagnosis not present

## 2016-04-12 DIAGNOSIS — Z720 Tobacco use: Secondary | ICD-10-CM | POA: Diagnosis not present
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
